# Patient Record
Sex: Male | Born: 1980 | Race: White | Hispanic: No | Marital: Single | Smoking: Never smoker
Health system: Southern US, Community
[De-identification: ages and names within clinical notes are randomized; demographics above are authoritative.]

## PROBLEM LIST (undated history)

## (undated) DIAGNOSIS — L509 Urticaria, unspecified: Secondary | ICD-10-CM

## (undated) HISTORY — PX: SHOULDER SURGERY: SHX246

## (undated) HISTORY — PX: SINOSCOPY: SHX187

## (undated) HISTORY — DX: Urticaria, unspecified: L50.9

---
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
## 2010-07-14 ENCOUNTER — Inpatient Hospital Stay: Payer: Self-pay | Admitting: Internal Medicine

## 2011-12-01 ENCOUNTER — Other Ambulatory Visit: Payer: Self-pay | Admitting: Internal Medicine

## 2011-12-01 ENCOUNTER — Ambulatory Visit
Admission: RE | Admit: 2011-12-01 | Discharge: 2011-12-01 | Disposition: A | Discharge status: Home / Self Care | Payer: BC Managed Care – PPO | Source: Ambulatory Visit | Attending: Internal Medicine | Admitting: Internal Medicine

## 2011-12-01 DIAGNOSIS — N50819 Testicular pain, unspecified: Secondary | ICD-10-CM

## 2012-02-09 IMAGING — CT CT HEAD WITHOUT CONTRAST
2 series · 16 of 30 positions shown, 20 images · non-contrast
Comparison: none

REASON FOR EXAM: syncope with head trauma
COMMENTS:

PROCEDURE:     CT  - CT HEAD WITHOUT CONTRAST  - July 14, 2010  [DATE]
RESULT:     Technique: Helical 5mm sections were obtained from the skull
base to the vertex without administration of intravenous contrast.

[Series 2: without · axial · non-contrast · 0.43mm/px · z∈[+194,+319]mm · 13 of 31 slices shown, 17 images]
[im 3/31  brain]
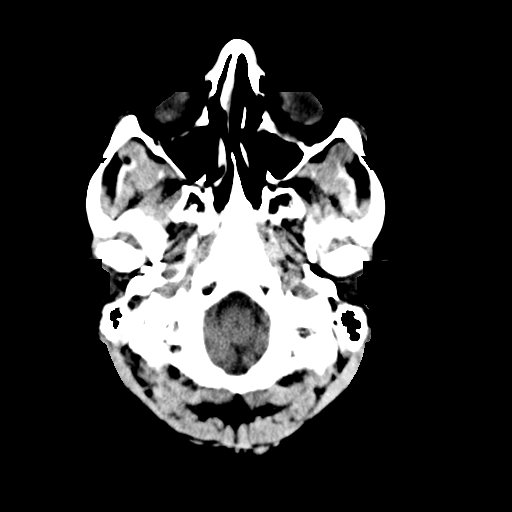
[im 3/31  bone]
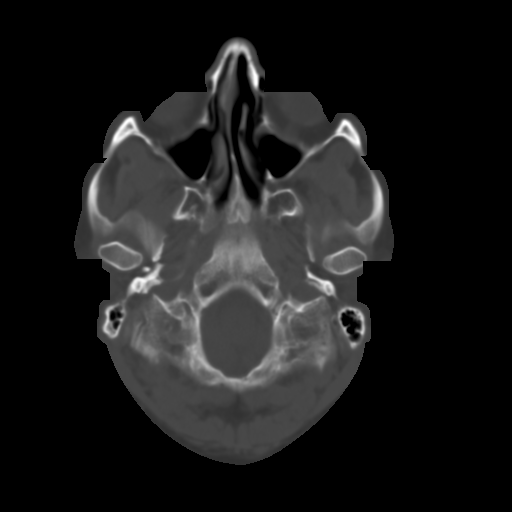
[im 5/31  brain]
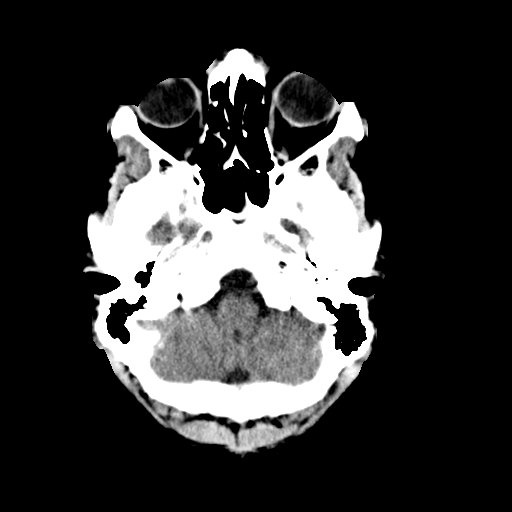
[im 7/31  brain]
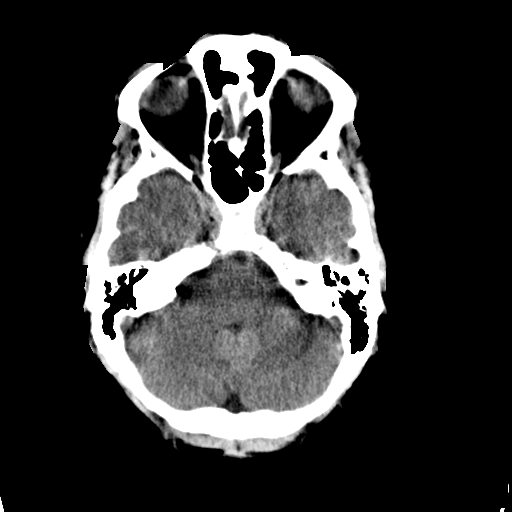
[im 9/31  brain]
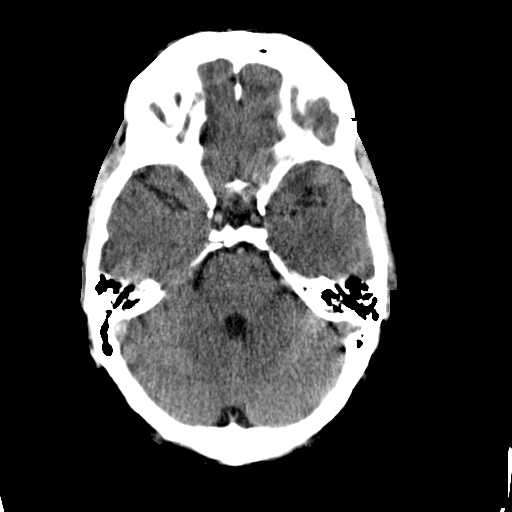
[im 11/31  brain]
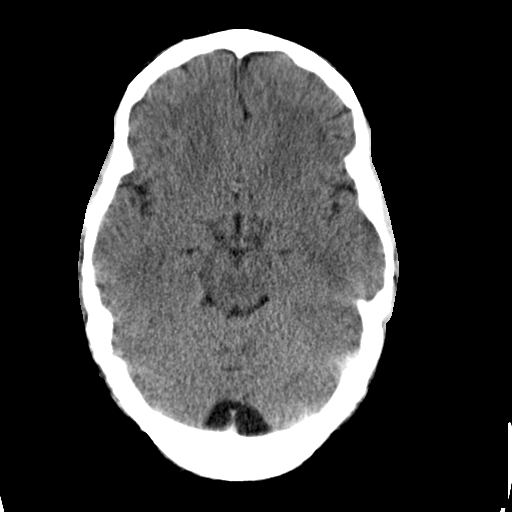
[im 11/31  bone]
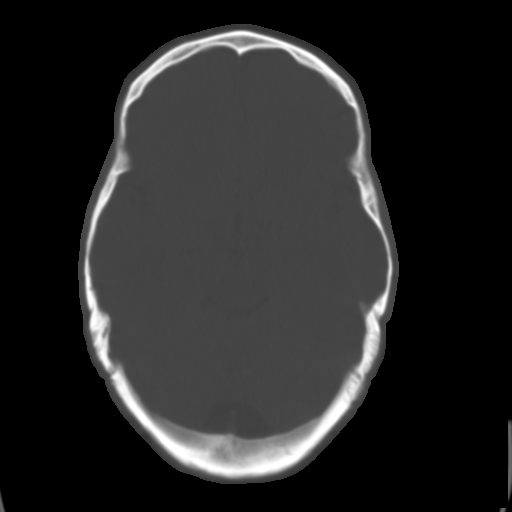
[im 13/31  brain]
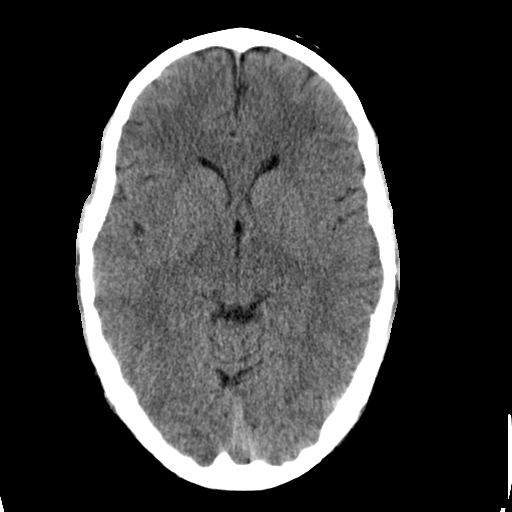
[im 16/31  brain]
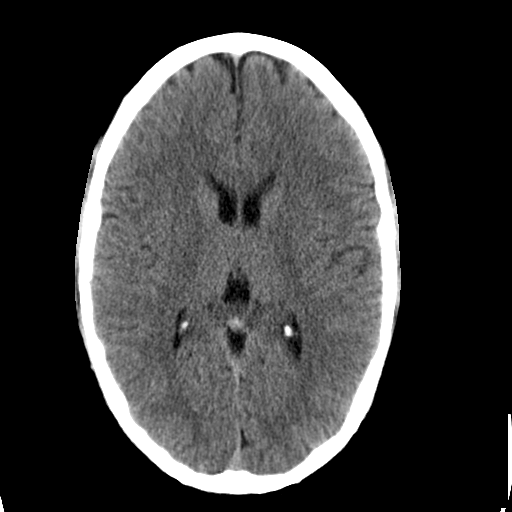
[im 18/31  brain]
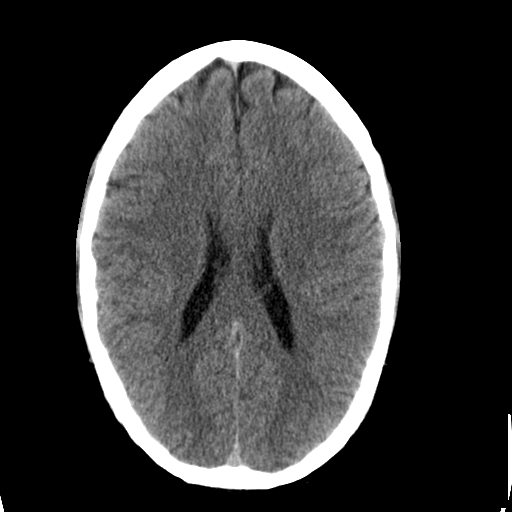
[im 20/31  brain]
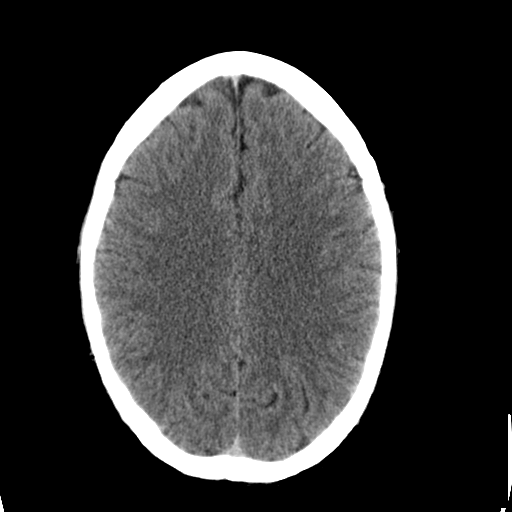
[im 20/31  bone]
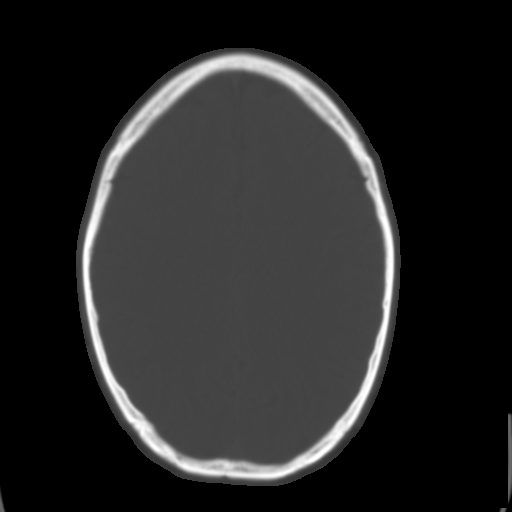
[im 22/31  brain]
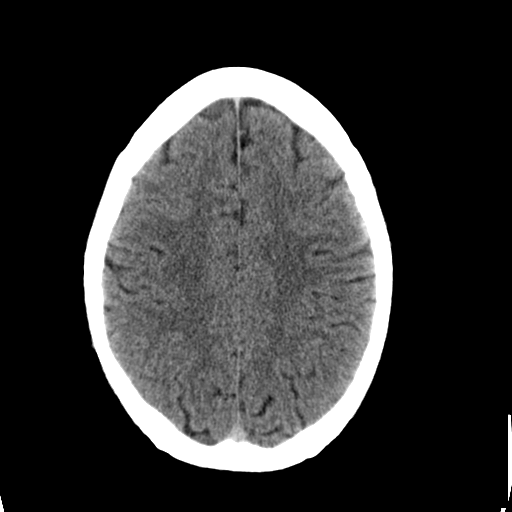
[im 24/31  brain]
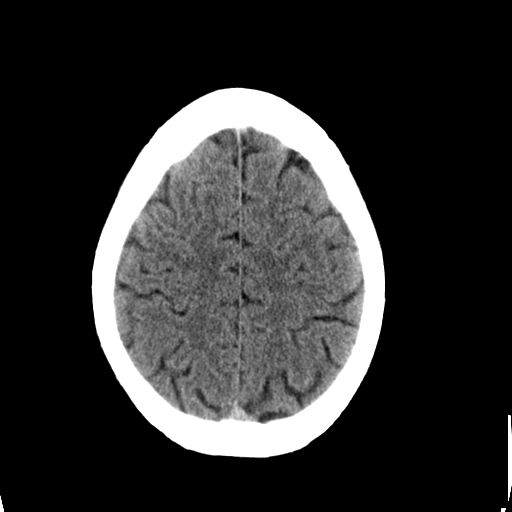
[im 26/31  brain]
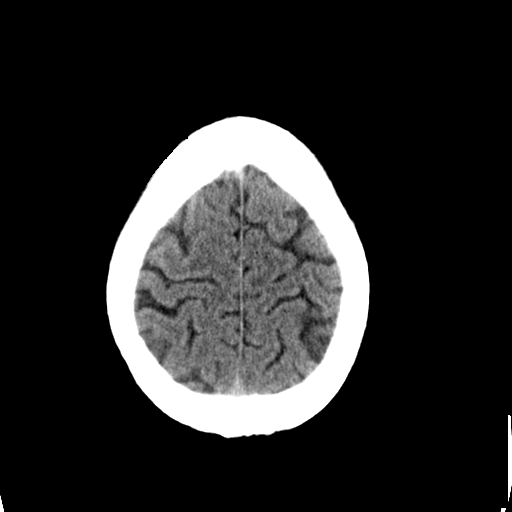
[im 28/31  brain]
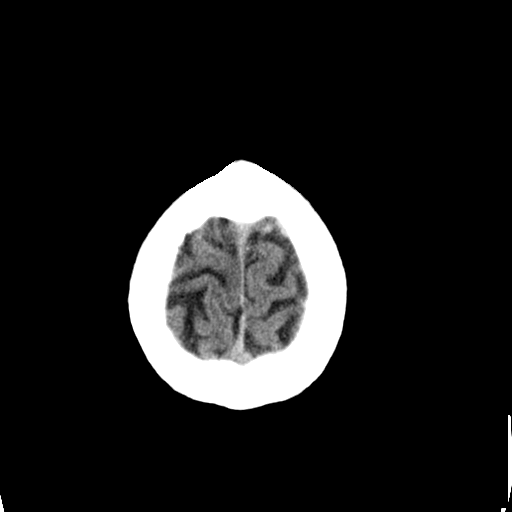
[im 28/31  bone]
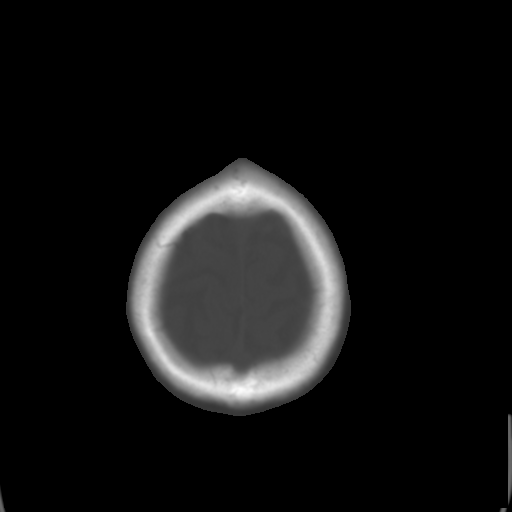

[Series 3: bone · axial · 0.43mm/px · z∈[+194,+234]mm · 3 of 31 slices shown]
[im 3/31  bone]
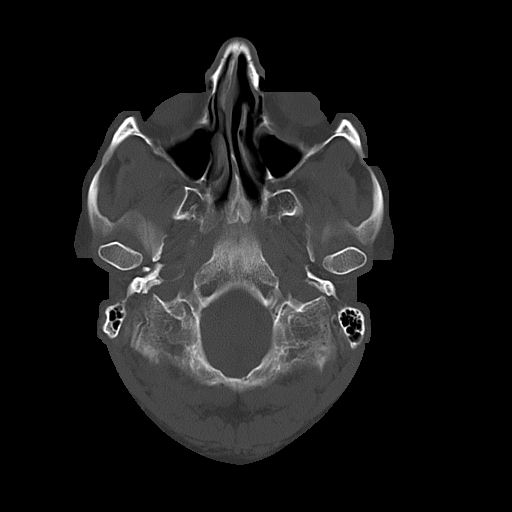
[im 7/31  bone]
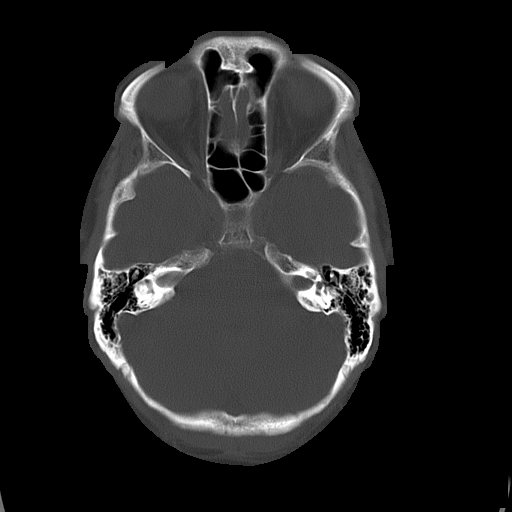
[im 11/31  bone]
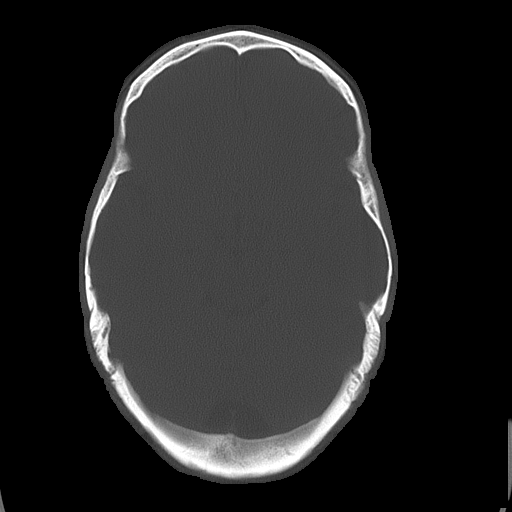

[16 of 30 positions shown; findings below may reference images not displayed]

FINDINGS: There is not evidence of intra-axial fluid collections. There is
no evidence of acute hemorrhage or secondary signs reflecting mass effect or
subacute or chronic focal territorial infarction. The osseous structures
demonstrate no evidence of a depressed skull fracture. If there is
persistent concern clinical follow-up with MRI is recommended.
IMPRESSION: 1. No evidence of acute intracranial abnormalitites.

## 2013-06-28 IMAGING — US US SCROTUM
1 series · 14 of 25 positions shown · non-contrast
Comparison: None

CLINICAL DATA: Left testicular pain

SCROTAL ULTRASOUND
DOPPLER ULTRASOUND OF THE TESTICLES
TECHNIQUE: Complete ultrasound examination of the testicles,
epididymis, and other scrotal structures was performed.  Color and
spectral Doppler ultrasound were also utilized to evaluate blood
flow to the testicles.

[Series 1: us scrotum · 0.07mm/px · 14 of 70 slices shown]
[im 1/70]
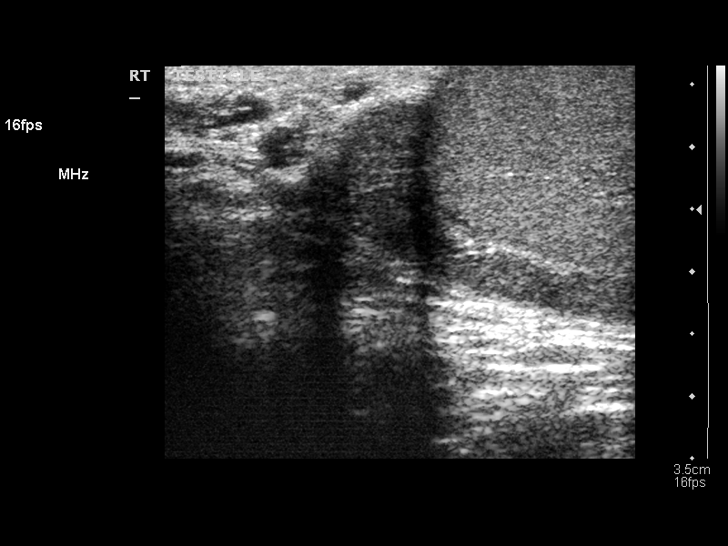
[im 6/70]
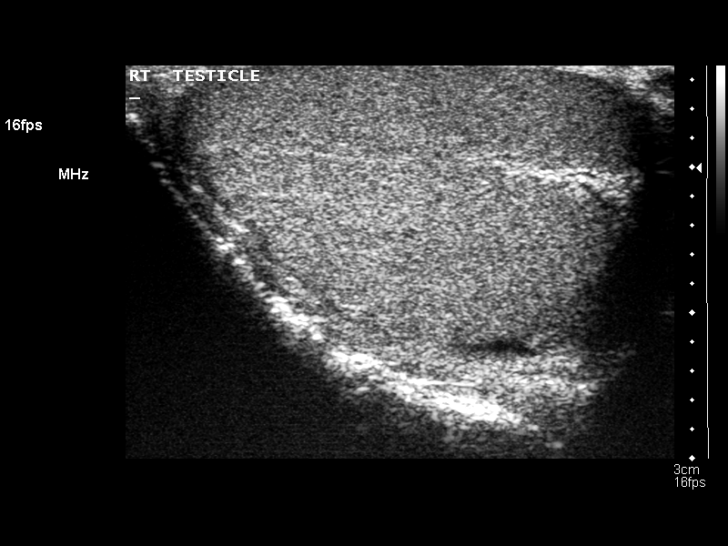
[im 12/70]
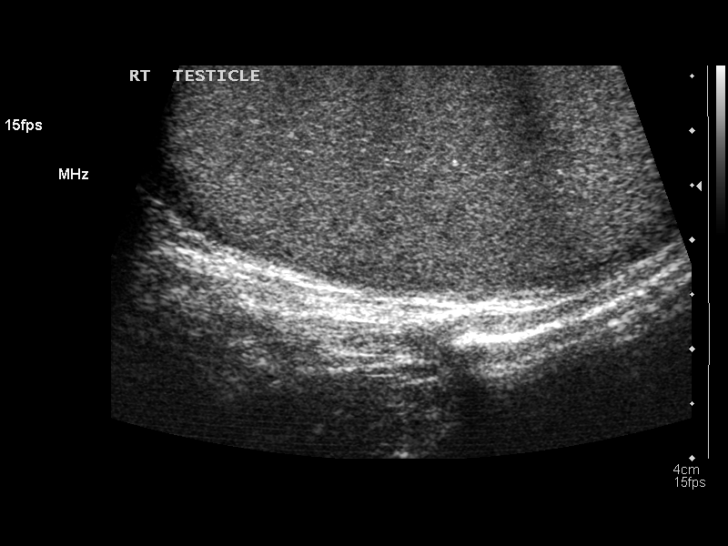
[im 18/70]
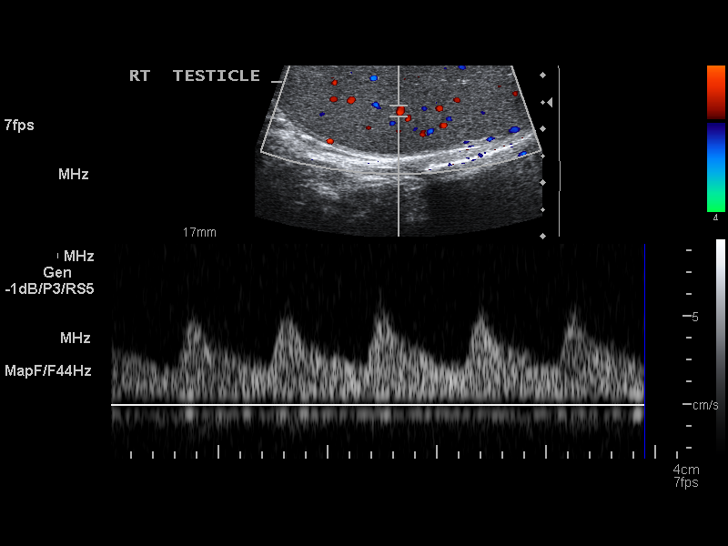
[im 24/70]
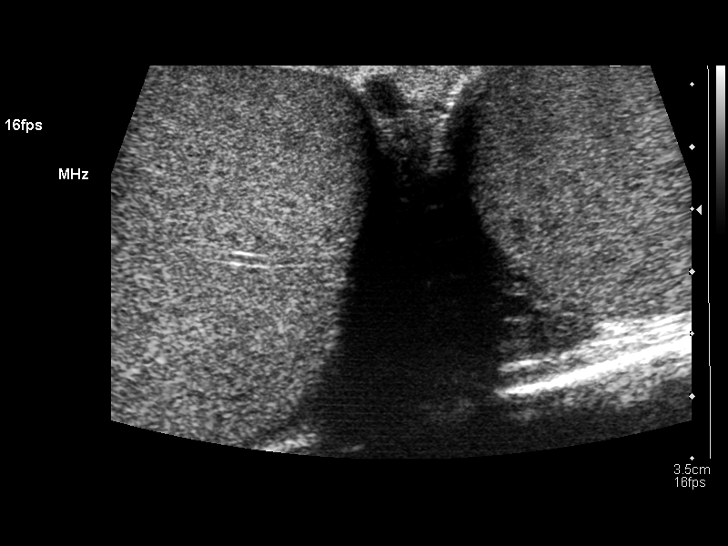
[im 26/70]
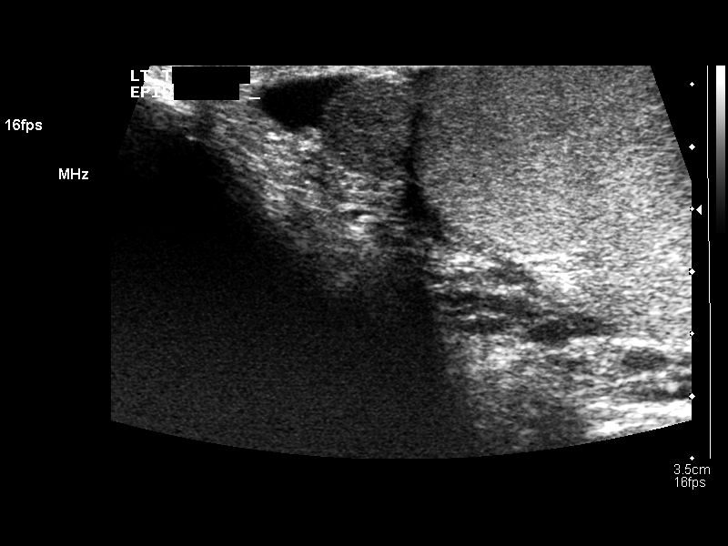
[im 32/70]
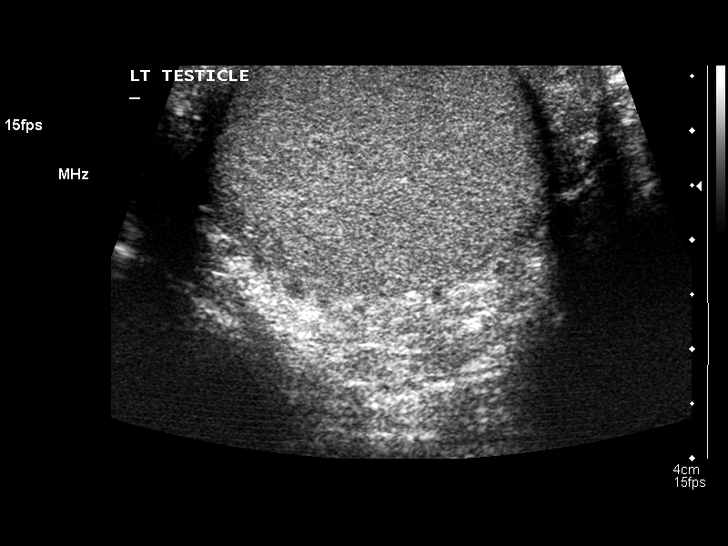
[im 38/70]
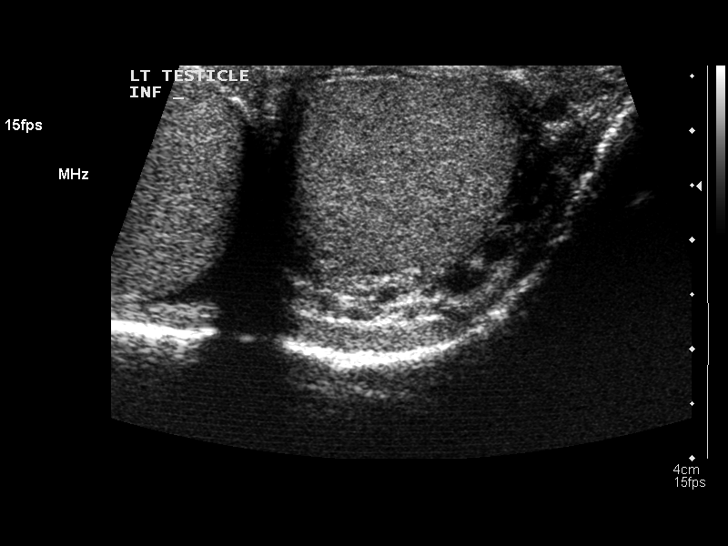
[im 44/70]
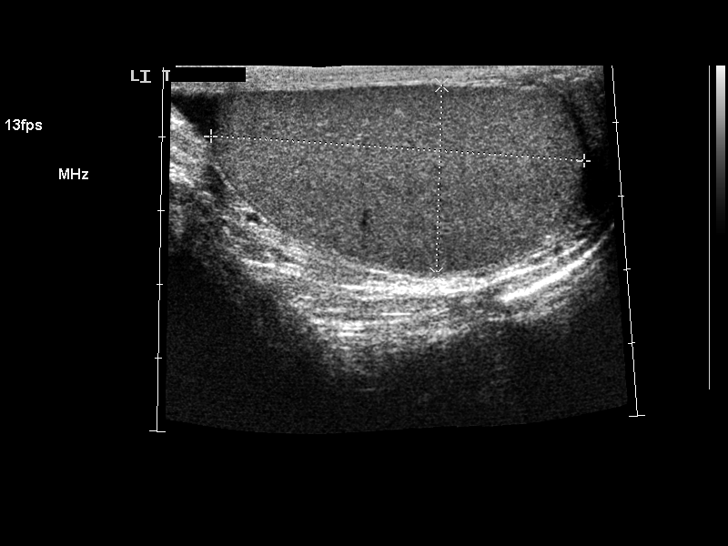
[im 47/70]
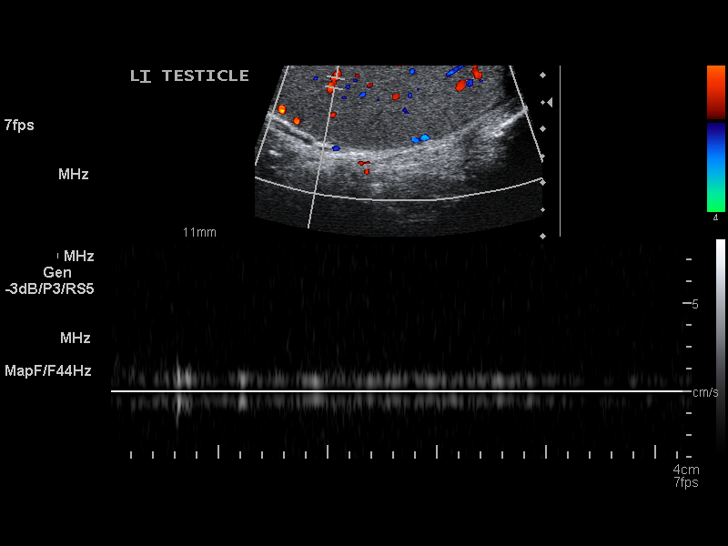
[im 52/70]
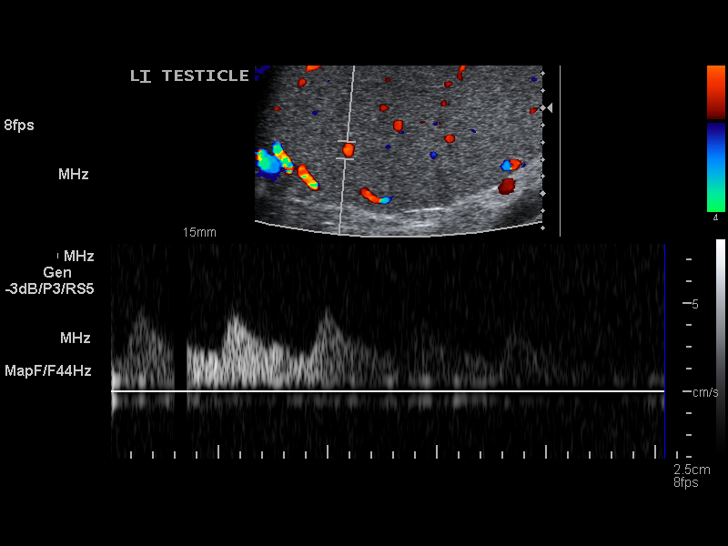
[im 58/70]
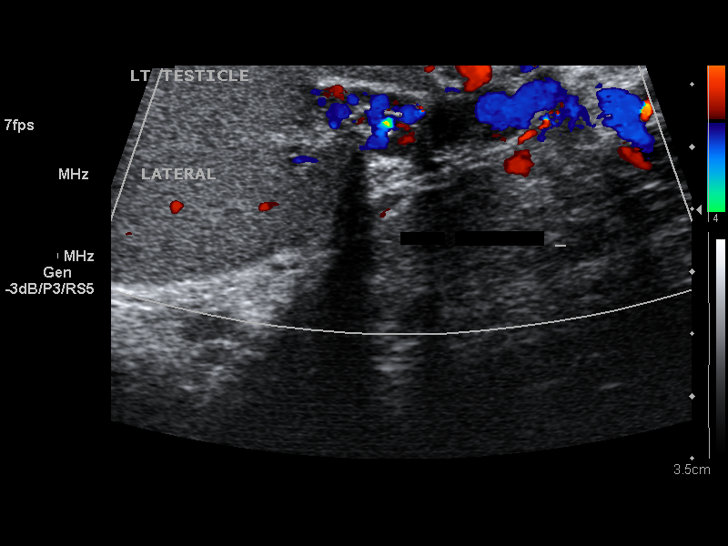
[im 64/70]
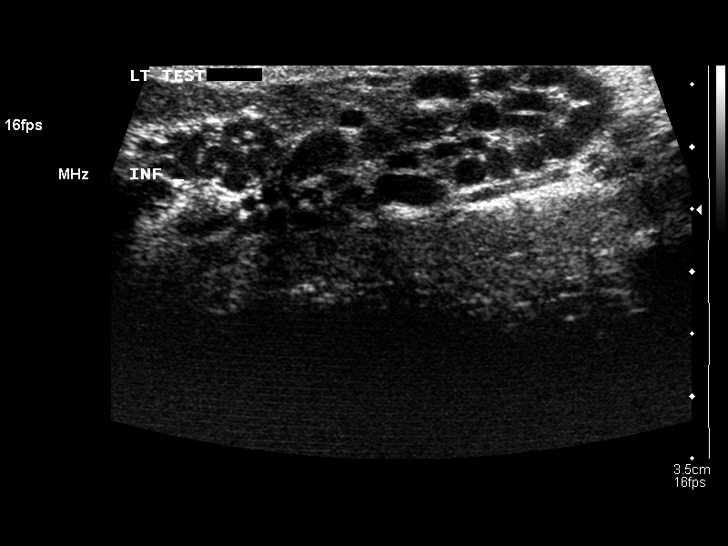
[im 70/70]
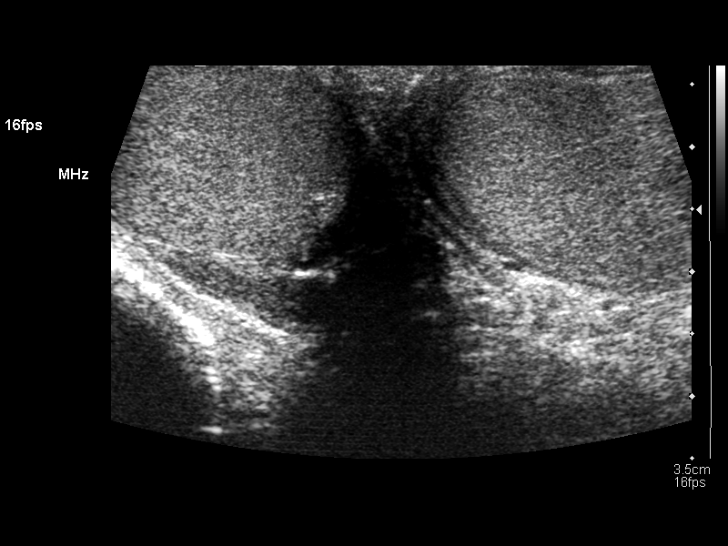

[14 of 25 positions shown; findings below may reference images not displayed]

FINDINGS: Both testicles have normal size and echotexture.  The
right testicle measures 4.5 x 2.1 x 3.7 cm, and the left testicle
measures 5.1 x 2.6 x 3.5 cm.

There is a moderate-sized varicocele on the left.  Both epididymi
have a normal appearance.  There is a small simple hydrocele on the
left.

Pulsed Doppler interrogation of both testes demonstrates symmetric,
low resistance flow bilaterally.
IMPRESSION: No evidence of testicular mass or torsion.

Moderate sized left varicocele.

Small simple left hydrocele.

## 2019-03-04 ENCOUNTER — Other Ambulatory Visit: Payer: Self-pay

## 2019-03-04 ENCOUNTER — Ambulatory Visit (INDEPENDENT_AMBULATORY_CARE_PROVIDER_SITE_OTHER): Payer: 59 | Admitting: Allergy

## 2019-03-04 ENCOUNTER — Encounter: Payer: Self-pay | Admitting: Allergy

## 2019-03-04 VITALS — BP 128/72 | HR 84 | Temp 97.3°F | Resp 16 | Ht 69.5 in | Wt 192.0 lb

## 2019-03-04 DIAGNOSIS — L237 Allergic contact dermatitis due to plants, except food: Secondary | ICD-10-CM

## 2019-03-04 MED ORDER — PREDNISONE 10 MG PO TABS: ORAL_TABLET | ORAL | 0 refills | Status: AC

## 2019-03-04 MED ORDER — MOMETASONE FUROATE 0.1 % EX OINT: TOPICAL_OINTMENT | CUTANEOUS | 0 refills | Status: AC

## 2019-03-04 NOTE — Assessment & Plan Note (Addendum)
Sensitivity to poison ivy/sumac for many years but worse the last 3 months requiring multiple rounds of prednisone. No cellulitis.  Discussed with patient at length that there is no skin testing for poison ivy/sumac.  Based on clinical history he does have sensitivity to poison ivy/sumac.   The most important thing is prevention/avoidance. Discussed wearing long pants/shirts and washing all clothes carefully when he comes in contact with them.   Gave rx for prednisone taper to take if he has an episode and not wait until it is blistering. Let us know when he has to start it. Take pictures of the rash.  May take zyrtec 10mg  1-2 times a day to help with the itching.  May use mometasone ointment twice a day on the rash. Do not use any other creams as some OTC creams can make this worse. Do not use on the face, neck, armpits or groin area.

## 2019-03-04 NOTE — Patient Instructions (Signed)
The most important thing about poison ivy/sumac is avoidance and washing everything that may have come in contact with the poison ivy/sumac plants.  You can take zyrtec 10mg  1-2 times a day to help with the itching.  Start prednisone taper if needed. If you start it then let us know.  Take pictures of the rash.  May use mometasone ointment twice a day on the rash. Do not use any other creams.  Do not use on the face, neck, armpits or groin area.   Follow up in 1 year

## 2019-03-04 NOTE — Progress Notes (Signed)
New Patient Note  RE: Larry Sims MRN: [id number] DOB: 1981/03/30 Date of Office Visit: 03/04/2019  Referring provider: Bernerd Limbo, MD Primary care provider: Bernerd Limbo, MD  Chief Complaint: Allergic Reaction (severe reaction to poison ivy/sumac and [place] creepers. develops large purulent blisters. he does have scars left from previous reactions. he is not truly interested in full skin testing mainly the poison plants issue )  History of Present Illness: I had the pleasure of seeing Larry Sims for initial evaluation at the [facility] on 03/04/2019. He is a 38 y.o. male, who is referred here by Bernerd Limbo, MD for the evaluation of contact dermatitis.   Patient had issues with poison ivy/sumac his whole but the past 3 months he had some major irritations and he was on prednisone for the past 3 months which helped. Did not need antibiotics. [place] ivy[place]sumac. Sometimes when his 38 yr old goes outside and then touches his skin, he breaks out.   He has tried the following therapies: topical creams with no benefit. Systemic steroids yes but needs to start taper at 60mg  to be effective. Currently on no medications.  Previous work up includes: none. Previous history of rash/hives: no.  Assessment and Plan: Haile is a 38 y.o. male with: Contact dermatitis due to poison ivy Sensitivity to poison ivy/sumac for many years but worse the last 3 months requiring multiple rounds of prednisone. No cellulitis.  Discussed with patient at length that there is no skin testing for poison ivy/sumac.  Based on clinical history he does have sensitivity to poison ivy/sumac.   The most important thing is prevention/avoidance. Discussed wearing long pants/shirts and washing all clothes carefully when he comes in contact with them.   Gave rx for prednisone taper to take if he has an episode and not wait until it is  blistering. Let us know when he has to start it. Take pictures of the rash.  May take zyrtec 10mg  1-2 times a day to help with the itching.  May use mometasone ointment twice a day on the rash. Do not use any other creams as some OTC creams can make this worse. Do not use on the face, neck, armpits or groin area.   Return in about 1 year (around 03/03/2020).  Meds ordered this encounter  Medications   predniSONE (DELTASONE) 10 MG tablet    Sig: Take prednisone 40mg  daily x 2 days, 30mg  daily x 2 days, 20mg  daily x 2 days and 10mg  daily x 2 days.    Dispense:  20 tablet    Refill:  0   mometasone (ELOCON) 0.1 % ointment    Sig: Apply twice a day on the rash as needed for up to 3 weeks at a time. Do not use on the face, neck, armpits or groin area.    Dispense:  45 g    Refill:  0   Other allergy screening: Asthma: no Rhino conjunctivitis: no Food allergy: no Medication allergy: no Hymenoptera allergy: no Urticaria: no Eczema:no History of recurrent infections suggestive of immunodeficency: no  Diagnostics: None.  Past Medical History: Patient Active Problem List   Diagnosis Date Noted   Contact dermatitis due to poison ivy 03/04/2019   Past Medical History:  Diagnosis Date   Urticaria    Past Surgical History: Past Surgical History:  Procedure Laterality Date   SHOULDER SURGERY  SINOSCOPY     Medication List:  Current Outpatient Medications  Medication Sig Dispense Refill   mometasone (ELOCON) 0.1 % ointment Apply twice a day on the rash as needed for up to 3 weeks at a time. Do not use on the face, neck, armpits or groin area. 45 g 0   predniSONE (DELTASONE) 10 MG tablet Take prednisone 40mg  daily x 2 days, 30mg  daily x 2 days, 20mg  daily x 2 days and 10mg  daily x 2 days. 20 tablet 0   No current facility-administered medications for this visit.    Allergies: No Known Allergies Social History: Social History   Socioeconomic History   Marital  status: Single    Spouse name: Not on file   Number of children: Not on file   Years of education: Not on file   Highest education level: Not on file  Occupational History   Not on file  Social Needs   Financial resource strain: Not on file   Food insecurity    Worry: Not on file    Inability: Not on file   Transportation needs    Medical: Not on file    Non-medical: Not on file  Tobacco Use   Smoking status: Never Smoker   Smokeless tobacco: Never Used  Substance and Sexual Activity   Alcohol use: Yes    Alcohol/week: 2.0 standard drinks    Types: 2 Standard drinks or equivalent per week   Drug use: Never   Sexual activity: Not on file  Lifestyle   Physical activity    Days per week: Not on file    Minutes per session: Not on file   Stress: Not on file  Relationships   Social connections    Talks on phone: Not on file    Gets together: Not on file    Attends religious service: Not on file    Active member of club or organization: Not on file    Attends meetings of clubs or organizations: Not on file    Relationship status: Not on file  Other Topics Concern   Not on file  Social History Narrative   Not on file   Lives in a house built in [place]. Smoking: denies Occupation: founder/CEO  Environmental History in the house: no in the family room: no Carpet in the bedroom: no Heating: electric Cooling: central Pet: yes dog and cat oudroos  Family History: Family History  Problem Relation Age of Onset   Crohn's disease Sister    Allergic rhinitis Neg Hx    Angioedema Neg Hx    Asthma Neg Hx    Atopy Neg Hx    Eczema Neg Hx    Immunodeficiency Neg Hx    Urticaria Neg Hx    Review of Systems  Constitutional: Negative for appetite change, chills, fever and unexpected weight change.  HENT: Negative for congestion and rhinorrhea.   Eyes: Negative for itching.  Respiratory: Negative for cough, chest  tightness, shortness of breath and wheezing.   Cardiovascular: Negative for chest pain.  Gastrointestinal: Negative for abdominal pain.  Genitourinary: Negative for difficulty urinating.  Skin: Positive for rash.  Allergic/Immunologic: Negative for environmental allergies and food allergies.  Neurological: Negative for headaches.   Objective: BP 128/72 (BP Location: Left Arm, Patient Position: Sitting, Cuff Size: Normal)    Pulse 84    Temp (!) 97.3 F (36.3 C) (Temporal)    Resp 16    Ht 5' 9.5" (1.765 m)  Wt 192 lb (87.1 kg)    SpO2 97%    BMI 27.95 kg/m  Body mass index is 27.95 kg/m. Physical Exam  Constitutional: He is oriented to person, place, and time. He appears well-developed and well-nourished.  HENT:  Head: Normocephalic and atraumatic.  Right Ear: External ear normal.  Left Ear: External ear normal.  Nose: Nose normal.  Mouth/Throat: Oropharynx is clear and moist.  Eyes: Conjunctivae and EOM are normal.  Neck: Neck supple.  Cardiovascular: Normal rate, regular rhythm and normal heart sounds. Exam reveals no gallop and no friction rub.  No murmur heard. Pulmonary/Chest: Effort normal and breath sounds normal. He has no wheezes. He has no rales.  Abdominal: Soft.  Neurological: He is alert and oriented to person, place, and time.  Skin: Skin is warm. No rash noted.  Psychiatric: He has a normal mood and affect. His behavior is normal.  Nursing note and vitals reviewed.  The plan was reviewed with the patient/family, and all questions/concerned were addressed.  It was my pleasure to see Georg today and participate in his care. Please feel free to contact me with any questions or concerns.  Sincerely,  Wyline Mood, DO Allergy & Immunology  [facility] office: [phone] [facility] office: [phone] [place] office: [phone]

## 2022-12-31 ENCOUNTER — Other Ambulatory Visit: Payer: Self-pay

## 2022-12-31 ENCOUNTER — Encounter (HOSPITAL_COMMUNITY): Payer: Self-pay | Admitting: Emergency Medicine

## 2022-12-31 ENCOUNTER — Emergency Department (HOSPITAL_COMMUNITY)
Admission: EM | Admit: 2022-12-31 | Discharge: 2022-12-31 | Disposition: A | Discharge status: Home / Self Care | Payer: Managed Care, Other (non HMO) | Attending: Emergency Medicine | Admitting: Emergency Medicine

## 2022-12-31 DIAGNOSIS — M25561 Pain in right knee: Secondary | ICD-10-CM | POA: Diagnosis present

## 2022-12-31 DIAGNOSIS — L509 Urticaria, unspecified: Secondary | ICD-10-CM | POA: Insufficient documentation

## 2022-12-31 DIAGNOSIS — S80261A Insect bite (nonvenomous), right knee, initial encounter: Secondary | ICD-10-CM | POA: Diagnosis not present

## 2022-12-31 DIAGNOSIS — W57XXXA Bitten or stung by nonvenomous insect and other nonvenomous arthropods, initial encounter: Secondary | ICD-10-CM | POA: Insufficient documentation

## 2022-12-31 MED ORDER — DOXYCYCLINE HYCLATE 100 MG PO CAPS: 100.0000 mg | ORAL_CAPSULE | Freq: Two times a day (BID) | ORAL | 0 refills | Status: AC

## 2022-12-31 NOTE — ED Notes (Signed)
The pt has a  red swollen rt knee he thinks he was bitten by some type insect 2 days ago    he did not see anything

## 2022-12-31 NOTE — Discharge Instructions (Addendum)
I have sent doxycyline to your pharmacy.   Take entire course of doxycycline until completed.   Please use Tylenol or ibuprofen for pain.  You may use 600 mg ibuprofen every 6 hours or 1000 mg of Tylenol every 6 hours.  You may choose to alternate between the 2.  This would be most effective.  Not to exceed 4 g of Tylenol within 24 hours.  Not to exceed 3200 mg ibuprofen 24 hours.   Warm compresses to your right knee 4 times daily.  I will like you to have your knee reevaluated if it appears worse in 2 days/48 hours

## 2022-12-31 NOTE — ED Provider Notes (Signed)
[facility] AT [facility] Provider Note   CSN: [id number] Arrival date & time: 12/31/22  1939     History {Add pertinent medical, surgical, social history, OB history to HPI:1} Chief Complaint  Patient presents with   Insect Bite    Larry Sims is a 42 y.o. male.  HPI        Home Medications Prior to Admission medications   Medication Sig Start Date End Date Taking? Authorizing Provider  doxycycline (VIBRAMYCIN) 100 MG capsule Take 1 capsule (100 mg total) by mouth 2 (two) times daily for 7 days. 12/31/22 01/07/23 Yes Prestin Munch S, PA  mometasone (ELOCON) 0.1 % ointment Apply twice a day on the rash as needed for up to 3 weeks at a time. Do not use on the face, neck, armpits or groin area. 03/04/19   Ellamae Sia, DO  predniSONE (DELTASONE) 10 MG tablet Take prednisone 40mg  daily x 2 days, 30mg  daily x 2 days, 20mg  daily x 2 days and 10mg  daily x 2 days. 03/04/19   Ellamae Sia, DO      Allergies    Patient has no known allergies.    Review of Systems   Review of Systems  Physical Exam Updated Vital Signs BP 117/79   Pulse 92   Temp 97.8 F (36.6 C)   Resp 18   SpO2 96%  Physical Exam Vitals and nursing note reviewed.  Constitutional:      General: He is not in acute distress.    Appearance: Normal appearance. He is not ill-appearing.  HENT:     Head: Normocephalic and atraumatic.     Mouth/Throat:     Mouth: Mucous membranes are moist.  Eyes:     General: No scleral icterus.       Right eye: No discharge.        Left eye: No discharge.     Conjunctiva/sclera: Conjunctivae normal.  Pulmonary:     Effort: Pulmonary effort is normal.     Breath sounds: No stridor.  Musculoskeletal:     Comments: FROM of R knee   DP PT pulses NML  Skin:    General: Skin is warm and dry.     Comments: Pustule with some surrounding erythema ~0.5 cm Some faint erythema that is blanching in a 5cm surrounding area  Neurological:      Mental Status: He is alert and oriented to person, place, and time. Mental status is at baseline.     ED Results / Procedures / Treatments   Labs (all labs ordered are listed, but only abnormal results are displayed) Labs Reviewed - No data to display  EKG None  Radiology No results found.  Procedures Procedures  {Document cardiac monitor, telemetry assessment procedure when appropriate:1}  Medications Ordered in ED Medications - No data to display  ED Course/ Medical Decision Making/ A&P   {   Click here for ABCD2, HEART and other calculatorsREFRESH Note before signing :1}                          Medical Decision Making     Final Clinical Impression(s) / ED Diagnoses Final diagnoses:  Insect bite of right knee, initial encounter    Rx / DC Orders ED Discharge Orders          Ordered    doxycycline (VIBRAMYCIN) 100 MG capsule  2 times daily  12/31/22 2115            

## 2022-12-31 NOTE — ED Triage Notes (Signed)
Pt was bitten by something on his right knee yesterday.  INcreasing redness, swelling, and heat to area.  Has taken 50 mg of benadryl.
